# Patient Record
Sex: Female | Born: 1990 | Race: White | Hispanic: No | Marital: Single
Health system: Southern US, Community
[De-identification: ages and names within clinical notes are randomized; demographics above are authoritative.]

---
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
## 2011-04-27 ENCOUNTER — Emergency Department: Payer: Self-pay | Admitting: Emergency Medicine

## 2012-12-01 ENCOUNTER — Encounter: Payer: Self-pay | Admitting: Obstetrics and Gynecology

## 2012-12-25 ENCOUNTER — Observation Stay: Payer: Self-pay | Admitting: Obstetrics & Gynecology

## 2012-12-25 LAB — URINALYSIS, COMPLETE
Glucose,UR: 150 mg/dL (ref 0–75)
Ketone: NEGATIVE
Ph: 7 (ref 4.5–8.0)
RBC,UR: <1 /HPF (ref 0–5)
Specific Gravity: 1.012 (ref 1.003–1.030)
WBC UR: 1 /HPF (ref 0–5)

## 2014-06-11 ENCOUNTER — Emergency Department: Payer: Self-pay | Admitting: Emergency Medicine

## 2014-06-11 LAB — CBC WITH DIFFERENTIAL/PLATELET
Basophil #: 0 10*3/uL (ref 0.0–0.1)
Basophil %: 0.3 %
EOS PCT: 0.1 %
Eosinophil #: 0 10*3/uL (ref 0.0–0.7)
HCT: 42.5 % (ref 35.0–47.0)
HGB: 14.3 g/dL (ref 12.0–16.0)
LYMPHS ABS: 0.4 10*3/uL — AB (ref 1.0–3.6)
Lymphocyte %: 2.9 %
MCH: 30.5 pg (ref 26.0–34.0)
MCHC: 33.6 g/dL (ref 32.0–36.0)
MCV: 91 fL (ref 80–100)
MONO ABS: 0.3 x10 3/mm (ref 0.2–0.9)
MONOS PCT: 2.3 %
NEUTROS ABS: 13.6 10*3/uL — AB (ref 1.4–6.5)
Neutrophil %: 94.4 %
PLATELETS: 228 10*3/uL (ref 150–440)
RBC: 4.69 10*6/uL (ref 3.80–5.20)
RDW: 13.7 % (ref 11.5–14.5)
WBC: 14.4 10*3/uL — AB (ref 3.6–11.0)

## 2014-06-11 LAB — URINALYSIS, COMPLETE
BILIRUBIN, UR: NEGATIVE
Bacteria: NONE SEEN
Glucose,UR: NEGATIVE mg/dL (ref 0–75)
Leukocyte Esterase: NEGATIVE
Nitrite: NEGATIVE
PH: 5 (ref 4.5–8.0)
SPECIFIC GRAVITY: 1.029 (ref 1.003–1.030)
WBC UR: 4 /HPF (ref 0–5)

## 2014-06-11 LAB — COMPREHENSIVE METABOLIC PANEL
ALBUMIN: 3.4 g/dL (ref 3.4–5.0)
ALK PHOS: 55 U/L
ANION GAP: 9 (ref 7–16)
BILIRUBIN TOTAL: 0.9 mg/dL (ref 0.2–1.0)
BUN: 13 mg/dL (ref 7–18)
CALCIUM: 8.3 mg/dL — AB (ref 8.5–10.1)
CREATININE: 0.54 mg/dL — AB (ref 0.60–1.30)
Chloride: 106 mmol/L (ref 98–107)
Co2: 24 mmol/L (ref 21–32)
EGFR (African American): >60
EGFR (Non-African Amer.): >60
GLUCOSE: 97 mg/dL (ref 65–99)
OSMOLALITY: 278 (ref 275–301)
POTASSIUM: 3.9 mmol/L (ref 3.5–5.1)
SGOT(AST): 28 U/L (ref 15–37)
SGPT (ALT): 24 U/L
SODIUM: 139 mmol/L (ref 136–145)
TOTAL PROTEIN: 7.1 g/dL (ref 6.4–8.2)

## 2014-06-11 LAB — HCG, QUANTITATIVE, PREGNANCY: Beta Hcg, Quant.: 181397 m[IU]/mL — ABNORMAL HIGH

## 2014-10-18 NOTE — H&P (Signed)
L&D Evaluation:  History Expanded:  HPI 24 yo G1P0 WF at 28 weeks w c/o suprapubic pain and spotting when wiping post void tosay and earlier this week.  No contractions. No ROM. [facility], uncomplicated.   Patient's Medical History No Chronic Illness   Patient's Surgical History none   Medications Pre Natal Vitamins   Allergies NKDA   Social History none   Family History Non-Contributory   ROS:  ROS All systems were reviewed.  HEENT, CNS, GI, GU, Respiratory, CV, Renal and Musculoskeletal systems were found to be normal.   Exam:  Vital Signs stable   General no apparent distress   Mental Status clear   Abdomen gravid, non-tender   Estimated Fetal Weight Average for gestational age   Back no CVAT   Edema no edema   Pelvic no external lesions, cervix closed and thick   Mebranes Intact   FHT normal rate with no decels   Ucx absent   Skin dry   Impression:  Impression Eval for UTI. Pain and bleeding NOT assoc w Preterm Labor at this time.   Plan:  Plan UA, EFM/NST   Electronic Signatures: Letitia LibraHarris, Robert Paul (MD)  (Signed 18-Jul-14 19:16)  Authored: L&D Evaluation   Last Updated: 18-Jul-14 19:16 by Letitia LibraHarris, Robert Paul (MD)

## 2021-06-20 ENCOUNTER — Other Ambulatory Visit: Payer: Self-pay | Admitting: Family Medicine

## 2021-06-20 ENCOUNTER — Other Ambulatory Visit: Payer: Self-pay

## 2021-06-20 ENCOUNTER — Ambulatory Visit
Admission: RE | Admit: 2021-06-20 | Discharge: 2021-06-20 | Disposition: A | Discharge status: Home / Self Care | Payer: 59 | Source: Ambulatory Visit | Attending: Family Medicine | Admitting: Family Medicine

## 2021-06-20 DIAGNOSIS — R1013 Epigastric pain: Secondary | ICD-10-CM

## 2021-06-20 DIAGNOSIS — R1031 Right lower quadrant pain: Secondary | ICD-10-CM | POA: Diagnosis not present

## 2021-06-20 MED ORDER — IOHEXOL 300 MG/ML  SOLN
80.0000 mL | Freq: Once | INTRAMUSCULAR | Status: AC | PRN
  Administered 2021-06-20: 80 mL via INTRAVENOUS

## 2022-08-21 IMAGING — CT CT ABD-PELV W/ CM
2 of 4 series · 16 of 46 positions shown, 18 images · IV contrast (omnipaque)
Comparison: Ultrasound 12/04/2007

CLINICAL DATA: Right lower quadrant abdominal pain.

EXAM:
CT ABDOMEN AND PELVIS WITH CONTRAST
TECHNIQUE: Multidetector CT imaging of the abdomen and pelvis was performed
using the standard protocol following bolus administration of
intravenous contrast.
CONTRAST:  80mL OMNIPAQUE IOHEXOL 300 MG/ML  SOLN

[Series 2: abd pelvis 5.00 · axial · 0.67mm/px · z∈[-1471,-1096]mm · 13 of 87 slices shown, 15 images]
[im 6/87  soft-tissue]
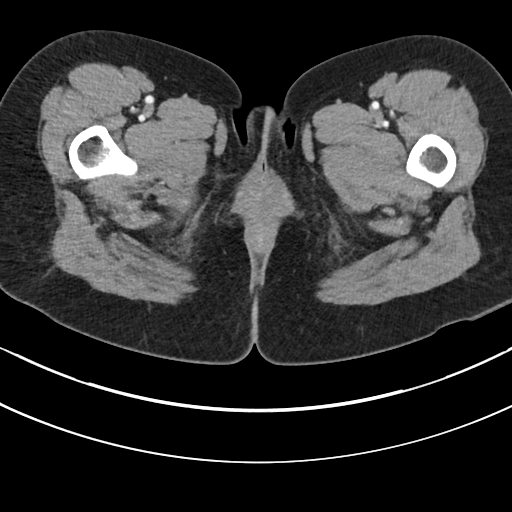
[im 6/87  bone]
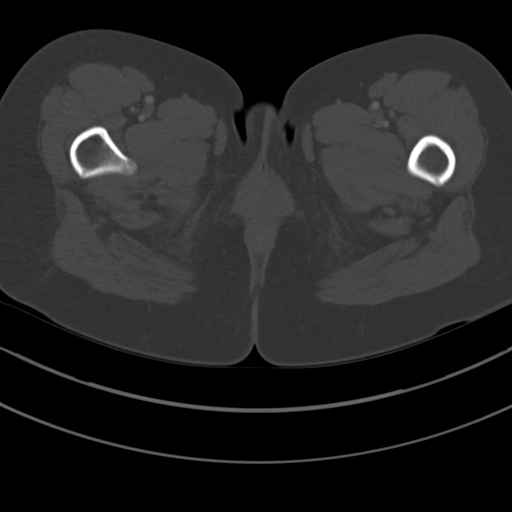
[im 12/87  soft-tissue]
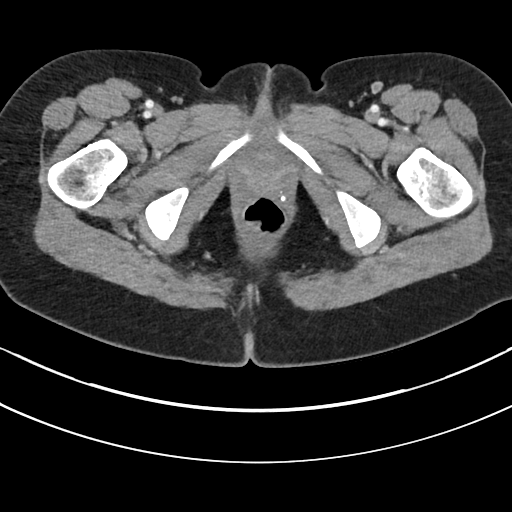
[im 18/87  soft-tissue]
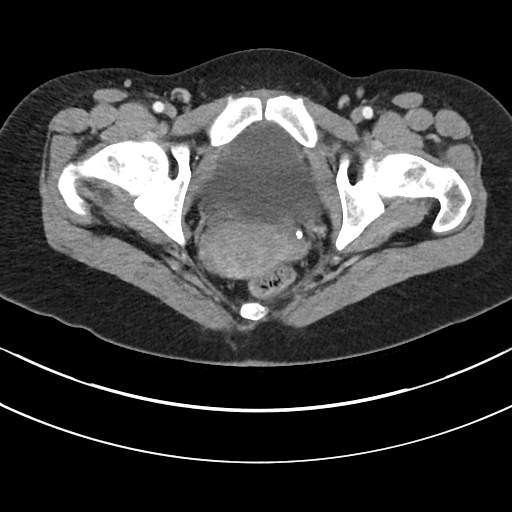
[im 23/87  soft-tissue]
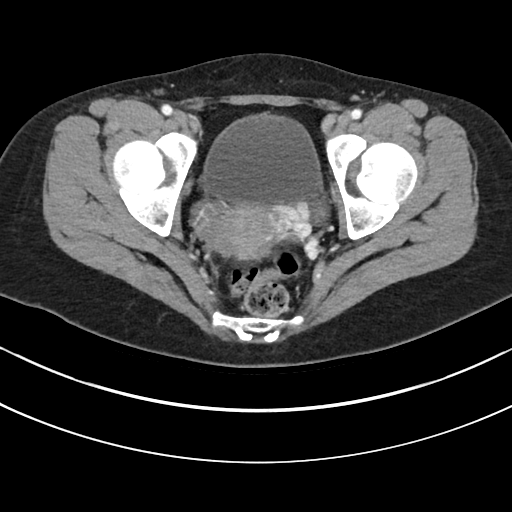
[im 29/87  soft-tissue]
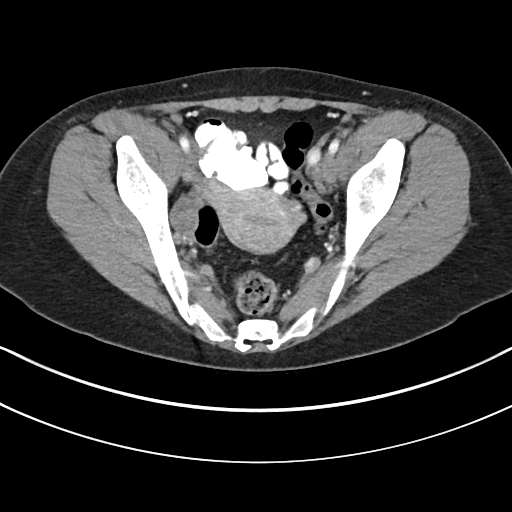
[im 35/87  soft-tissue]
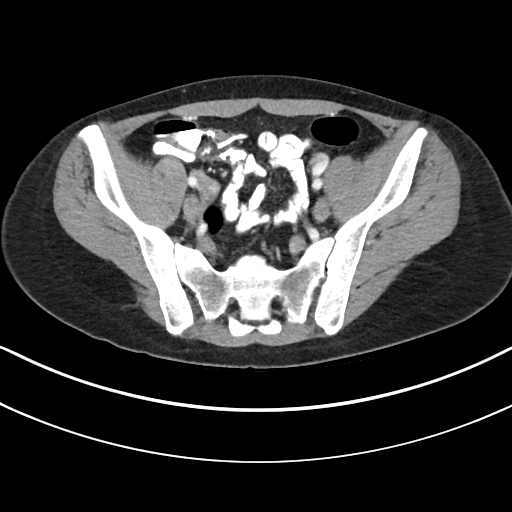
[im 46/87  soft-tissue]
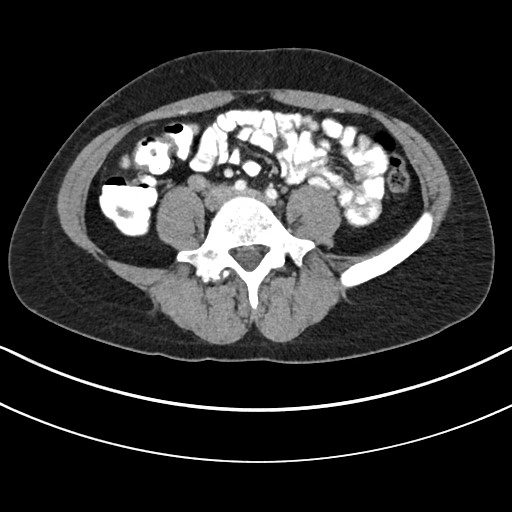
[im 52/87  soft-tissue]
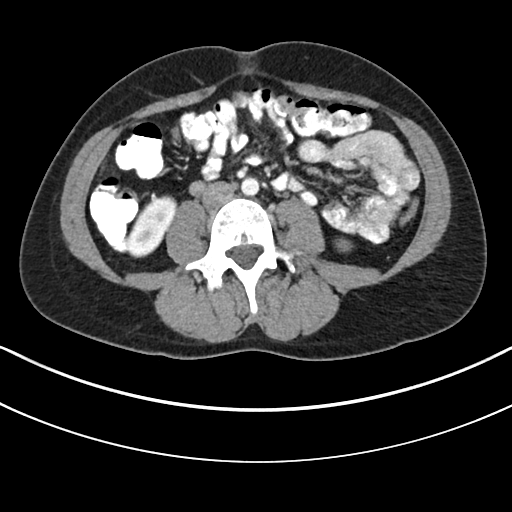
[im 58/87  soft-tissue]
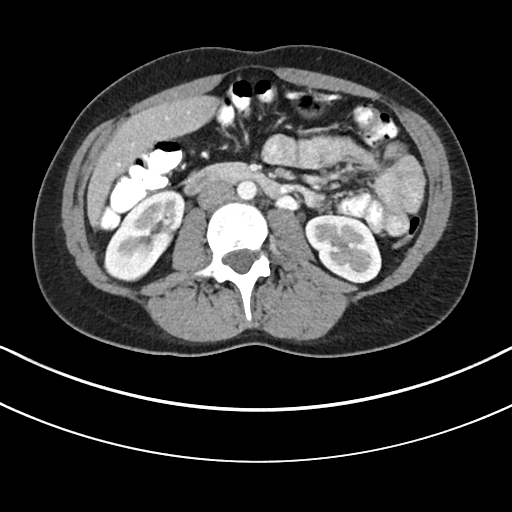
[im 58/87  bone]
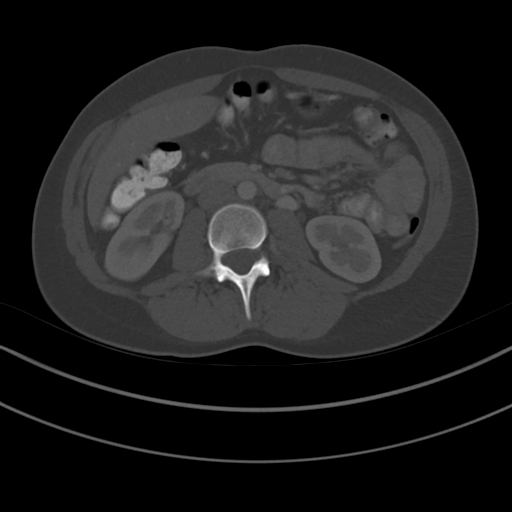
[im 64/87  soft-tissue]
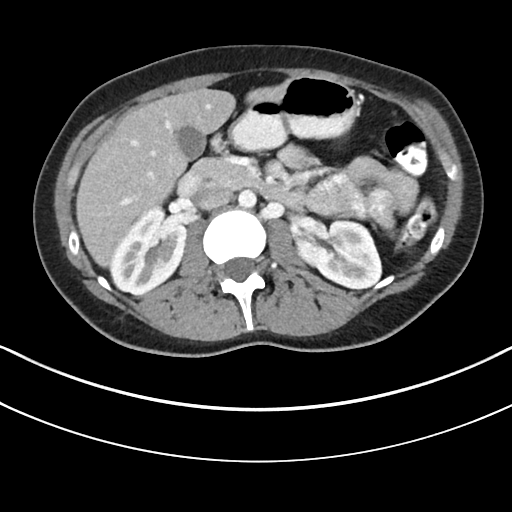
[im 69/87  soft-tissue]
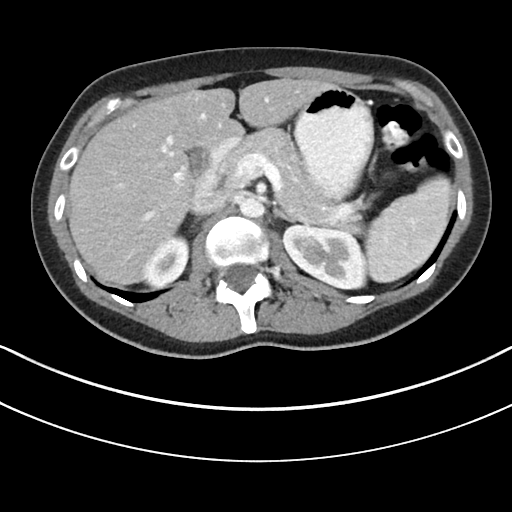
[im 75/87  soft-tissue]
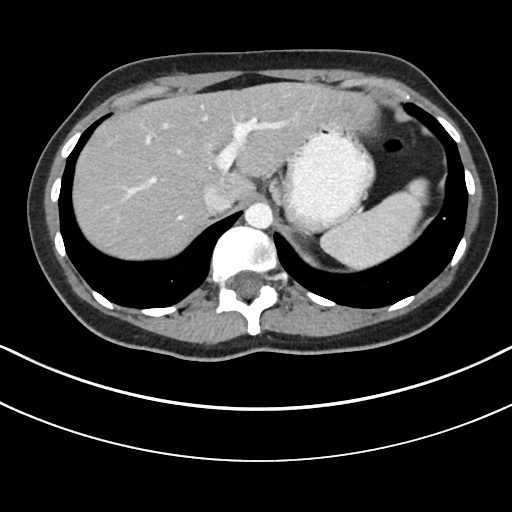
[im 81/87  soft-tissue]
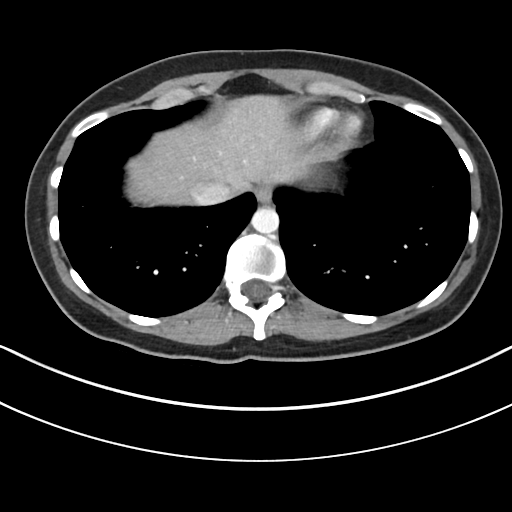

[Series 4: coronals abd pelvis 2.00 cor · coronal · 0.67mm/px · 3 of 132 slices shown]
[im 44/132  soft-tissue]
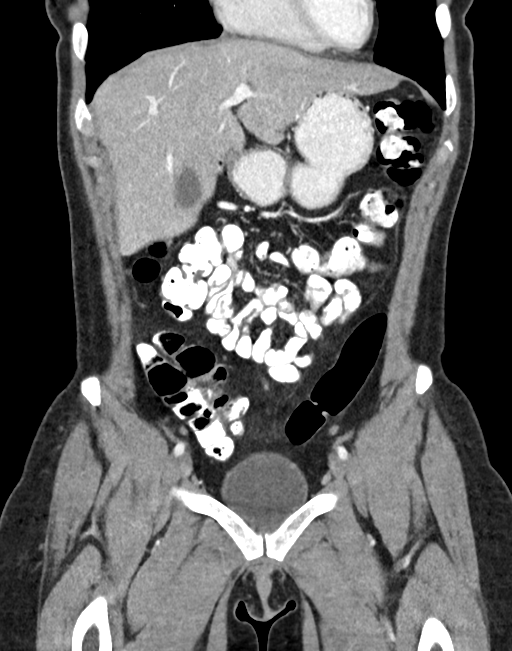
[im 59/132  soft-tissue]
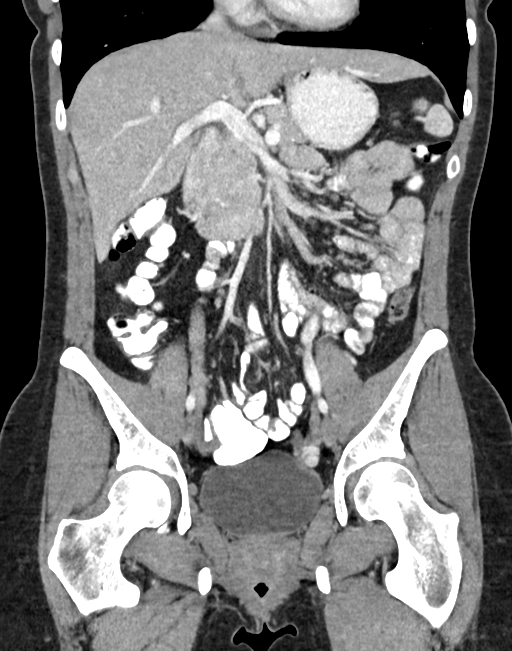
[im 73/132  soft-tissue]
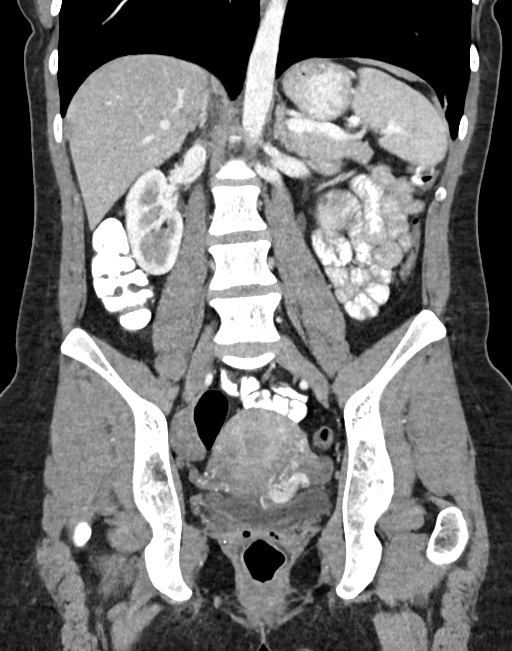

[16 of 46 positions shown; findings below may reference images not displayed]

FINDINGS: Lower chest: Normal

Hepatobiliary: Liver parenchyma is normal. There [DATE] or 2 tiny
stones dependent in the gallbladder. No CT evidence of cholecystitis
or obstruction.

Pancreas: Normal

Spleen: Normal

Adrenals/Urinary Tract: Adrenal glands are normal. Kidneys are
normal. No cyst, mass, stone or hydronephrosis. Bladder is normal.

Stomach/Bowel: Stomach and small intestine are normal. Normal
appendix. No abnormal colon finding.

Vascular/Lymphatic: Aorta and IVC are normal.  No adenopathy.

Reproductive: Normal.  No pelvic mass.

Other: No free fluid or air.

Musculoskeletal: No hernia. No focal bone lesion or arthritic
change. There is mild curvature of the spine which could indicate
scoliosis or positional curvature.
IMPRESSION: No abnormality seen to explain the clinical presentation. Normal
appendix. No other abnormal bowel finding.

There [DATE] or 2 tiny calcified stones dependent in the
gallbladder. No CT evidence of cholecystitis or obstruction however.

Mild curvature of the spine convex to the right. This could indicate
mild scoliosis or could be positional curvature.

These results will be called to the ordering clinician or
representative by the Radiologist Assistant, and communication
documented in the PACS or [REDACTED].
